# Patient Record
Sex: Female | Born: 1975 | Race: White | Hispanic: No | Marital: Married | Smoking: Current every day smoker
Health system: Southern US, Community
[De-identification: ages and names within clinical notes are randomized; demographics above are authoritative.]

## PROBLEM LIST (undated history)

## (undated) DIAGNOSIS — N200 Calculus of kidney: Secondary | ICD-10-CM

## (undated) HISTORY — PX: TUBAL LIGATION: SHX77

## (undated) HISTORY — PX: CHOLECYSTECTOMY: SHX55

---

## 2008-12-24 ENCOUNTER — Emergency Department: Payer: Self-pay | Admitting: Emergency Medicine

## 2009-01-15 ENCOUNTER — Ambulatory Visit: Payer: Self-pay | Admitting: Urology

## 2009-01-22 ENCOUNTER — Ambulatory Visit: Payer: Self-pay | Admitting: Urology

## 2009-01-29 ENCOUNTER — Ambulatory Visit: Payer: Self-pay | Admitting: Urology

## 2009-02-11 ENCOUNTER — Ambulatory Visit: Payer: Self-pay | Admitting: Urology

## 2009-03-11 ENCOUNTER — Ambulatory Visit: Payer: Self-pay | Admitting: Urology

## 2009-06-17 ENCOUNTER — Ambulatory Visit: Payer: Self-pay | Admitting: Urology

## 2009-07-21 ENCOUNTER — Ambulatory Visit: Payer: Self-pay | Admitting: Urology

## 2010-01-06 ENCOUNTER — Ambulatory Visit: Payer: Self-pay | Admitting: Urology

## 2010-07-05 ENCOUNTER — Inpatient Hospital Stay: Payer: Self-pay | Admitting: Unknown Physician Specialty

## 2010-11-19 ENCOUNTER — Ambulatory Visit: Payer: Self-pay | Admitting: Internal Medicine

## 2011-02-08 ENCOUNTER — Ambulatory Visit: Payer: Self-pay | Admitting: Family Medicine

## 2011-08-30 ENCOUNTER — Ambulatory Visit: Payer: Self-pay | Admitting: Internal Medicine

## 2011-10-13 ENCOUNTER — Emergency Department: Payer: Self-pay | Admitting: Emergency Medicine

## 2012-04-04 ENCOUNTER — Ambulatory Visit: Payer: Self-pay | Admitting: Internal Medicine

## 2012-04-04 LAB — URINALYSIS, COMPLETE
Bacteria: NEGATIVE
Glucose,UR: NEGATIVE mg/dL (ref 0–75)
Ketone: NEGATIVE
Nitrite: NEGATIVE
Ph: 6.5 (ref 4.5–8.0)
Protein: NEGATIVE
WBC UR: NONE SEEN /HPF (ref 0–5)

## 2012-04-06 LAB — URINE CULTURE

## 2012-04-23 ENCOUNTER — Ambulatory Visit: Payer: Self-pay

## 2012-04-23 LAB — URINALYSIS, COMPLETE
Bacteria: NEGATIVE
Bilirubin,UR: NEGATIVE
Ketone: NEGATIVE
Nitrite: NEGATIVE

## 2012-07-18 ENCOUNTER — Ambulatory Visit: Payer: Self-pay | Admitting: Family Medicine

## 2012-07-18 LAB — URINALYSIS, COMPLETE
Bilirubin,UR: NEGATIVE
Glucose,UR: NEGATIVE mg/dL (ref 0–75)
Ketone: NEGATIVE
Leukocyte Esterase: NEGATIVE
Ph: 7.5 (ref 4.5–8.0)
Specific Gravity: 1.01 (ref 1.003–1.030)

## 2012-07-19 LAB — URINE CULTURE

## 2013-06-06 IMAGING — CT CT STONE STUDY
1 of 2 series · 15 of 32 positions shown, 19 images · non-contrast
Comparison: none

REASON FOR EXAM: CR [id number]  abd pain hx kidney stones  eval for
kidney stones
COMMENTS:

[Series 2: abd 3mm wo 3.0 i40f 3 · axial · 0.72mm/px · z∈[-1448,-1044]mm · 15 of 153 slices shown, 19 images]
[im 12/153  soft-tissue]
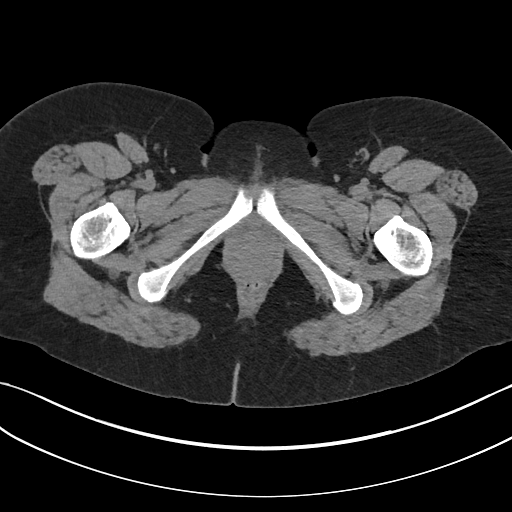
[im 12/153  bone]
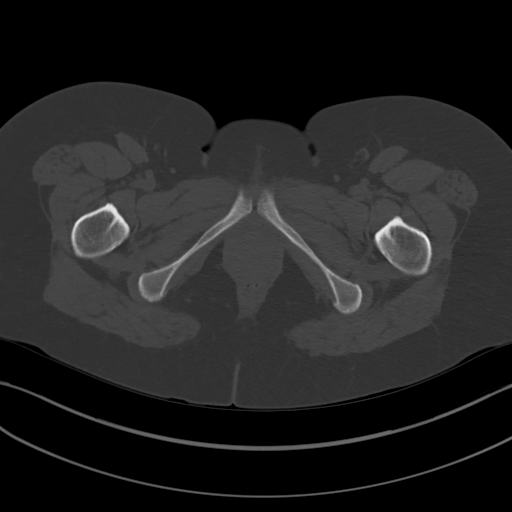
[im 23/153  soft-tissue]
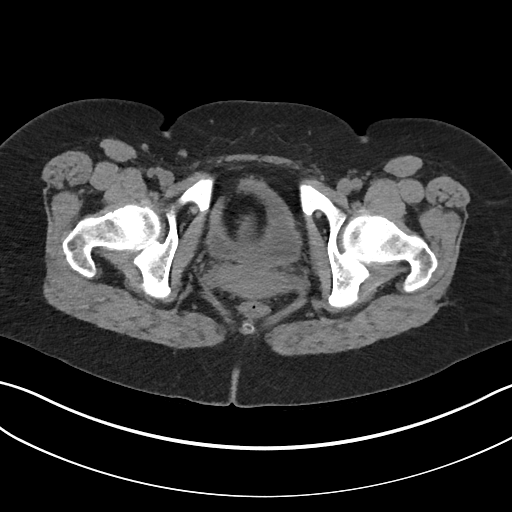
[im 34/153  soft-tissue]
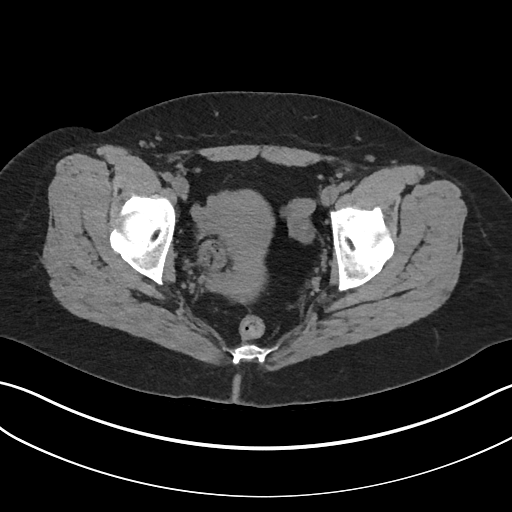
[im 46/153  soft-tissue]
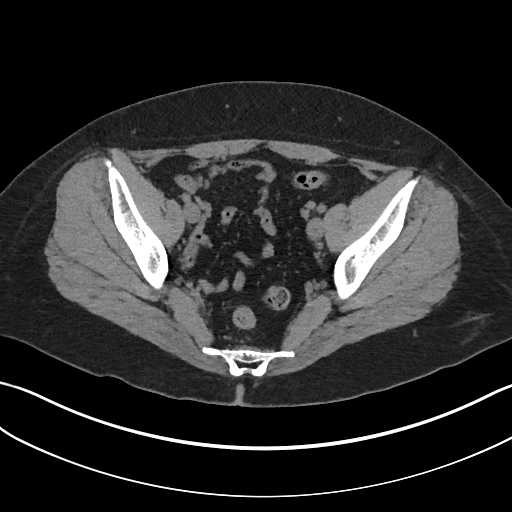
[im 57/153  soft-tissue]
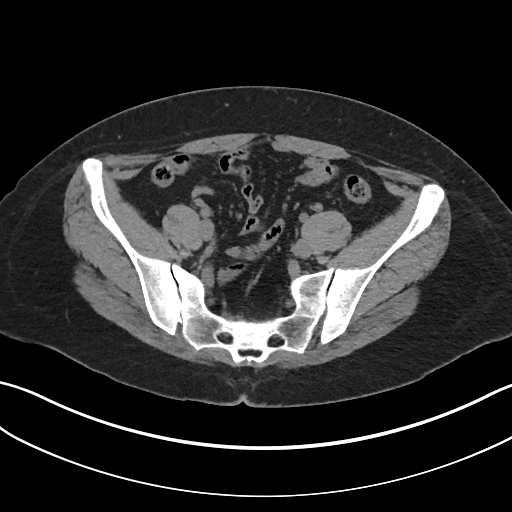
[im 68/153  soft-tissue]
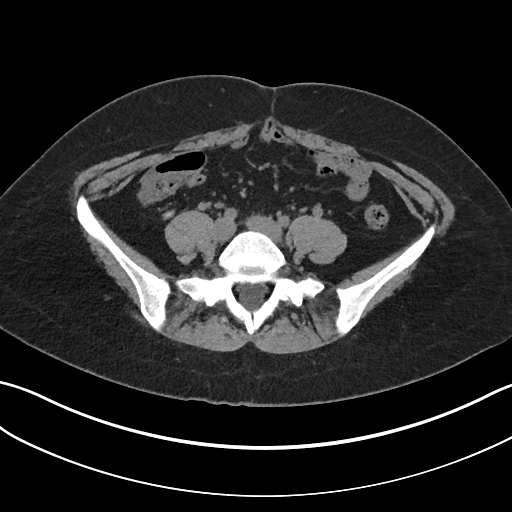
[im 79/153  soft-tissue]
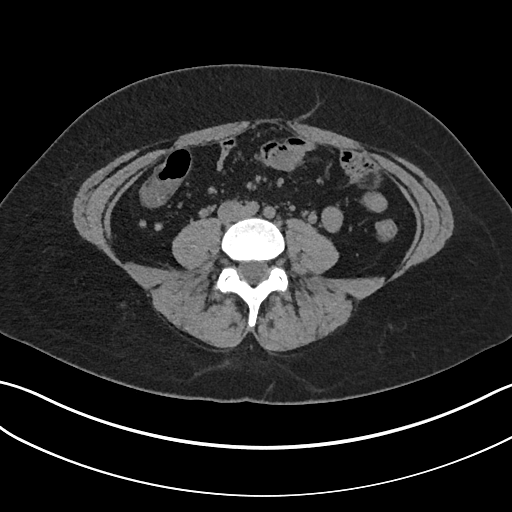
[im 91/153  soft-tissue]
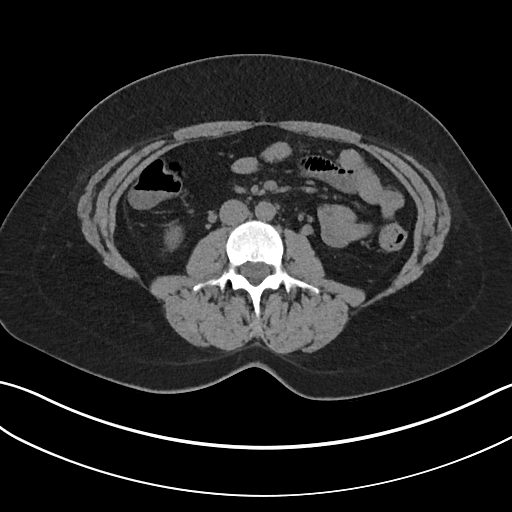
[im 102/153  soft-tissue]
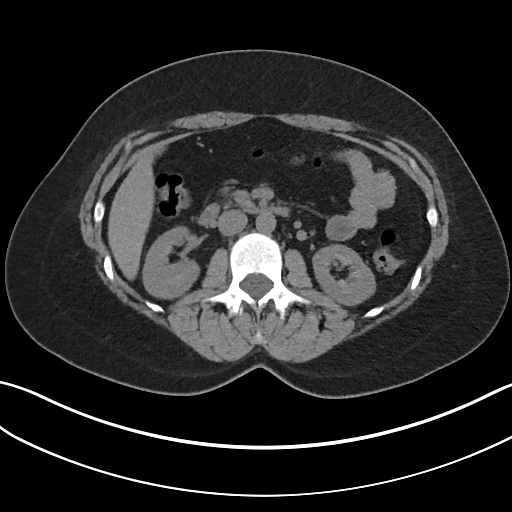
[im 102/153  bone]
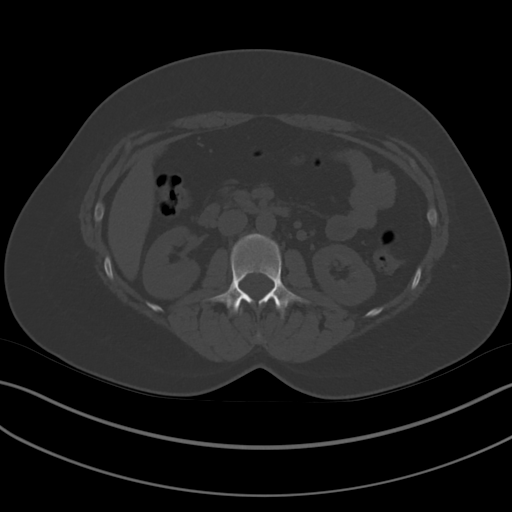
[im 113/153  soft-tissue]
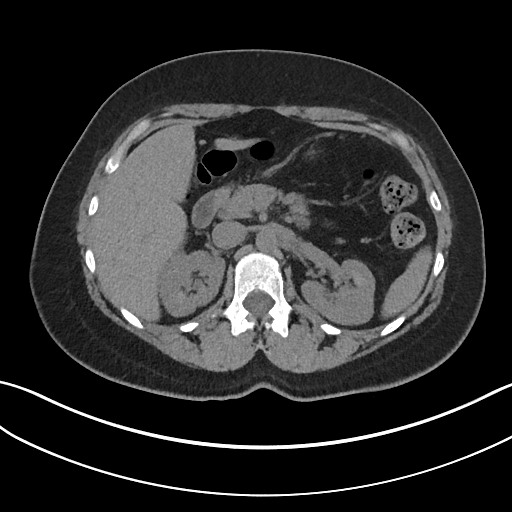
[im 124/153  soft-tissue]
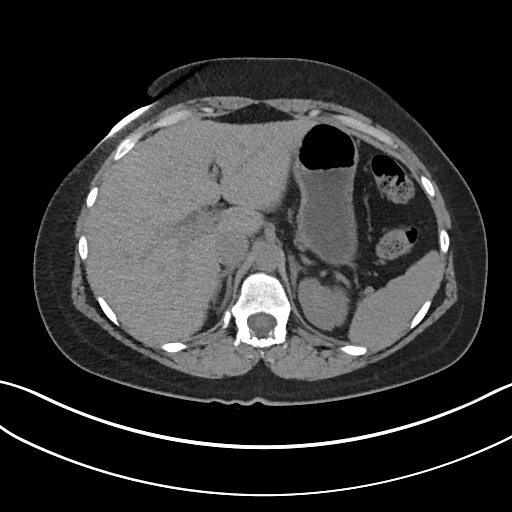
[im 130/153  lung]
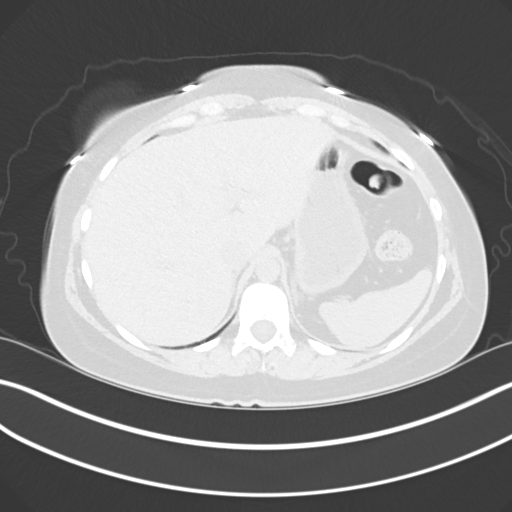
[im 136/153  soft-tissue]
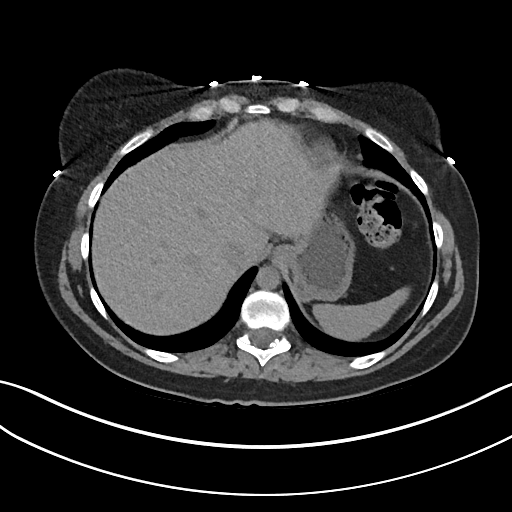
[im 136/153  lung]
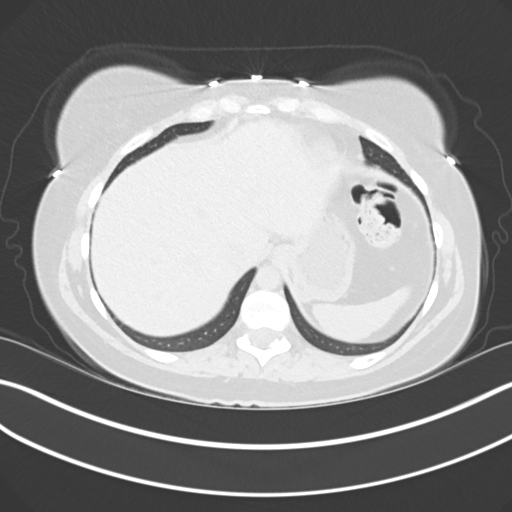
[im 141/153  lung]
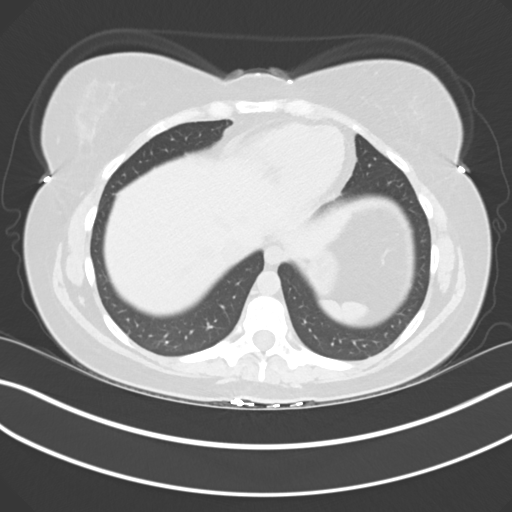
[im 147/153  soft-tissue]
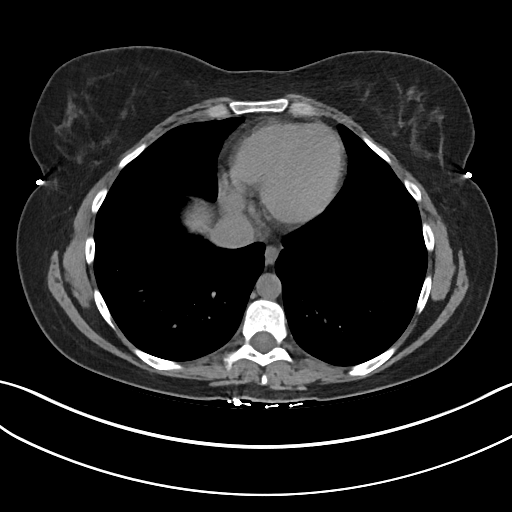
[im 147/153  lung]
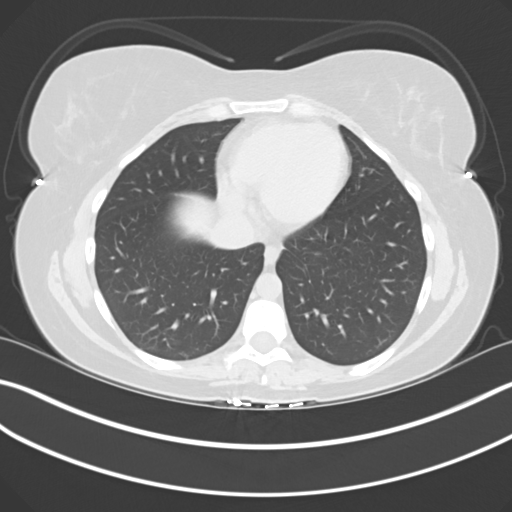

[15 of 32 positions shown; findings below may reference images not displayed]

PROCEDURE:     KCT - KCT ABDOMEN/PELVIS WO (STONE)  - April 23, 2012  [DATE]

RESULT:     Renal stone protocol noncontrast CT of the abdomen and pelvis is
compared to the previous exam dated 13 October, 2011.

There is no hydronephrosis or hydroureter. One to 2 mm nonobstructing lower
pole right renal calculus is seen on image 56 with a tiny nonobstructing 1
mm stone suggested on image 42 in the mid to upper right kidney.
Cholecystectomy clips are present. The liver, spleen, adrenal glands,
kidneys, aorta and appendix appear normal otherwise. There is no ascites or
abnormal fluid collection. The urinary bladder is nondistended. The uterus
and adnexal structures appear to be unremarkable with the exception of the
cystic mass in the right adnexal region measuring is much as 2.87 cm in
diameter. Followup ultrasound of the pelvis could be performed in 6 to 8
weeks to document resolution. There is a small fat filled umbilical hernia
area the lung bases appear clear. The bony structures are unremarkable.
IMPRESSION: 1. Right nephrolithiasis without obstruction.
2. The previously demonstrated distal left ureteral calculus is not present
on today's exam. The obstructive changes on the left have resolved.
3. Right adnexal cy[REDACTED]

## 2016-02-18 ENCOUNTER — Emergency Department: Payer: BLUE CROSS/BLUE SHIELD

## 2016-02-18 ENCOUNTER — Emergency Department
Admission: EM | Admit: 2016-02-18 | Discharge: 2016-02-18 | Disposition: A | Discharge status: Home / Self Care | Payer: BLUE CROSS/BLUE SHIELD | Attending: Emergency Medicine | Admitting: Emergency Medicine

## 2016-02-18 DIAGNOSIS — Z3202 Encounter for pregnancy test, result negative: Secondary | ICD-10-CM | POA: Insufficient documentation

## 2016-02-18 DIAGNOSIS — F17210 Nicotine dependence, cigarettes, uncomplicated: Secondary | ICD-10-CM | POA: Insufficient documentation

## 2016-02-18 DIAGNOSIS — R109 Unspecified abdominal pain: Secondary | ICD-10-CM | POA: Diagnosis not present

## 2016-02-18 HISTORY — DX: Calculus of kidney: N20.0

## 2016-02-18 LAB — URINALYSIS COMPLETE WITH MICROSCOPIC (ARMC ONLY)
BILIRUBIN URINE: NEGATIVE
GLUCOSE, UA: NEGATIVE mg/dL
Ketones, ur: NEGATIVE mg/dL
Leukocytes, UA: NEGATIVE
NITRITE: NEGATIVE
Protein, ur: NEGATIVE mg/dL
Specific Gravity, Urine: 1.002 — ABNORMAL LOW (ref 1.005–1.030)
pH: 7 (ref 5.0–8.0)

## 2016-02-18 LAB — POCT PREGNANCY, URINE: Preg Test, Ur: NEGATIVE

## 2016-02-18 MED ORDER — IBUPROFEN 600 MG PO TABS
ORAL_TABLET | ORAL | Status: AC
  Filled 2016-02-18: qty 1

## 2016-02-18 MED ORDER — HYDROCODONE-ACETAMINOPHEN 5-325 MG PO TABS
1.0000 | ORAL_TABLET | Freq: Once | ORAL | Status: AC
  Administered 2016-02-18: 1 via ORAL

## 2016-02-18 MED ORDER — HYDROCODONE-ACETAMINOPHEN 5-325 MG PO TABS: 1.0000 | ORAL_TABLET | Freq: Four times a day (QID) | ORAL | Status: DC | PRN

## 2016-02-18 MED ORDER — ONDANSETRON 4 MG PO TBDP: 4.0000 mg | ORAL_TABLET | Freq: Four times a day (QID) | ORAL | Status: DC | PRN

## 2016-02-18 MED ORDER — HYDROCODONE-ACETAMINOPHEN 5-325 MG PO TABS
ORAL_TABLET | ORAL | Status: AC
  Filled 2016-02-18: qty 1

## 2016-02-18 MED ORDER — IBUPROFEN 400 MG PO TABS
600.0000 mg | ORAL_TABLET | ORAL | Status: AC
  Administered 2016-02-18: 600 mg via ORAL

## 2016-02-18 NOTE — ED Provider Notes (Signed)
[facility] Emergency Department Provider Note  ____________________________________________  Time seen: Approximately 4:32 PM  I have reviewed the triage vital signs and the nursing notes.   HISTORY  Chief Complaint Flank Pain    HPI Tanya Stevens is a 40 y.o. female reports previous history of kidney stones and a tubal ligation.  Patient reports that when she got up this morning she started having sharp stabbing pain in the "kidney". She reports she's had the same pain about 5 times in the past with kidney stones. No nausea vomiting fevers or chills. No pain or trouble urinating. She denies any vaginal discharge or "pelvic pain". She did not have any pain on the right side and she in fact denies any pain in the abdomen anywhere, states all the pain is located in her left kidney area. Patient points at her left costovertebral angle and showing me the area of pain.  Currently described as moderate, sharp area not worsened by movement. States started to get into a comfortable position.   Past Medical History  Diagnosis Date  . Kidney stones     There are no active problems to display for this patient.   Past Surgical History  Procedure Laterality Date  . Cholecystectomy    . Tubal ligation      Current Outpatient Rx  Name  Route  Sig  Dispense  Refill  . HYDROcodone-acetaminophen (NORCO/VICODIN) 5-325 MG tablet   Oral   Take 1 tablet by mouth every 6 (six) hours as needed for moderate pain.   20 tablet   0   . ondansetron (ZOFRAN ODT) 4 MG disintegrating tablet   Oral   Take 1 tablet (4 mg total) by mouth every 6 (six) hours as needed for nausea or vomiting.   20 tablet   0     Allergies Review of patient's allergies indicates no known allergies.  No family history on file.  Social History Social History  Substance Use Topics  . Smoking status: Current Every Day Smoker    Types: Cigarettes  . Smokeless tobacco: None  . Alcohol  Use: Yes    Review of Systems Constitutional: No fever/chills Eyes: No visual changes. ENT: No sore throat. Cardiovascular: Denies chest pain. Respiratory: Denies shortness of breath. Gastrointestinal: No abdominal pain.  No nausea, no vomiting.  No diarrhea.  No constipation. Genitourinary: Negative for dysuria. Musculoskeletal: Negative for back pain except in the area of the left kidney. Skin: Negative for rash. Neurological: Negative for headaches, focal weakness or numbness.  10-point ROS otherwise negative.  ____________________________________________   PHYSICAL EXAM:  VITAL SIGNS: ED Triage Vitals  Enc Vitals Group     BP 02/18/16 1216 123/81 mmHg     Pulse Rate 02/18/16 1216 77     Resp 02/18/16 1216 16     Temp 02/18/16 1216 98.1 F (36.7 C)     Temp Source 02/18/16 1216 Oral     SpO2 02/18/16 1216 100 %     Weight 02/18/16 1216 152 lb (68.947 kg)     Height 02/18/16 1216  (1.626 m)     Head Cir --      Peak Flow --      Pain Score 02/18/16 1217 6     Pain Loc --      Pain Edu? --      Excl. in GC? --    Constitutional: Alert and oriented. Well appearing and in no acute distress. Eyes: Conjunctivae are normal. PERRL.  EOMI. Head: Atraumatic. Nose: No congestion/rhinnorhea. Mouth/Throat: Mucous membranes are moist.  Oropharynx non-erythematous. Neck: No stridor.   Cardiovascular: Normal rate, regular rhythm. Grossly normal heart sounds.  Good peripheral circulation. Respiratory: Normal respiratory effort.  No retractions. Lungs CTAB. Gastrointestinal: Soft and nontender. No distention. No abdominal bruits. No CVA tenderness on the right, moderate to severe tenderness on the left CVA. No pain at McBurney's point. Negative Murphy. No rebound guarding or evidence of peritonitis on abdominal exam. Musculoskeletal: No lower extremity tenderness nor edema.  No joint effusions. Neurologic:  Normal speech and language. No gross focal neurologic deficits are  appreciated. Skin:  Skin is warm, dry and intact. No rash noted. Psychiatric: Mood and affect are normal. Speech and behavior are normal.  ____________________________________________   LABS (all labs ordered are listed, but only abnormal results are displayed)  Labs Reviewed  URINALYSIS COMPLETEWITH MICROSCOPIC ([facility] ONLY) - Abnormal; Notable for the following:    Color, Urine STRAW (*)    APPearance CLEAR (*)    Specific Gravity, Urine 1.002 (*)    Hgb urine dipstick 1+ (*)    Bacteria, UA RARE (*)    Squamous Epithelial / LPF 0-5 (*)    All other components within normal limits  POC URINE PREG, ED  POCT PREGNANCY, URINE   ____________________________________________  EKG   ____________________________________________  RADIOLOGY  US Renal (Final result) Result time: 02/18/16 17:37:25   Final result by Rad Results In Interface (02/18/16 17:37:25)   Narrative:   CLINICAL DATA: Left flank pain for 8 hours. No known injury. Initial encounter.  EXAM: RENAL / URINARY TRACT ULTRASOUND COMPLETE  COMPARISON: CT abdomen and pelvis 04/23/2012.  FINDINGS: Right Kidney:  Length: 10.5 cm. Echogenicity within normal limits. No mass or hydronephrosis visualized.  Left Kidney:  Length: 10.4 cm. Echogenicity within normal limits. No mass or hydronephrosis visualized.  Bladder:  Appears normal for degree of bladder distention. Bilateral ureteral jets are seen.  IMPRESSION: Negative for hydronephrosis or evidence of urinary tract stone. No acute finding.   Electronically Signed By: Drusilla Kanner M.D. On: 02/18/2016 17:37    ____________________________________________   PROCEDURES  Procedure(s) performed: None  Critical Care performed: No  ____________________________________________   INITIAL IMPRESSION / ASSESSMENT AND PLAN / ED COURSE  Pertinent labs & imaging results that were available during my care of the patient were reviewed by me  and considered in my medical decision making (see chart for details).  Patient presents for left flank pain. She clearly denotes pain in the area of the left flank and costovertebral angle. Differential diagnosis includes but is not limited to, abdominal perforation, aortic dissection, cholecystitis, appendicitis, diverticulitis, colitis, esophagitis/gastritis, kidney stone, pyelonephritis, urinary tract infection, aortic aneurysm. All are considered in decision and treatment plan. Based upon the patient's presentation and risk factors, we will initially start with u/s renal. Her abdominal exam is benign, no evidence of surgical abdomen or peritonitis. Her vital signs are reassuring. Overall appears or focal complaint is left flank pain, and she reports same with previous kidney stones which I suspect this may be. No hematuria is noted, however I'll obtain ultrasound of the left kidney to further evaluate.  ----------------------------------------- 6:11 PM on 02/18/2016 -----------------------------------------  Patient reports that her pain is improved. She is currently resting comfortably in no distress. I did discuss stroke she did not clearly show a kidney stone, and she is requesting to go home as she states she feels better and has been here all day and feels that she  could manage well at home. She is confident this is from a kidney stone. I discussed that I cannot confirm this from a kidney stone, I recommended further evaluation including CT to evaluate for other causes of pain such as infection, aneurysm, ovarian cyst, a "twisted ovary" or other concerns. After discussing CT and further evaluation to rule out my concerns seen her daughter wished to go home. She is overall feeling better, she has a reassuring exam and currently has no abdominal pain and flank pain is better. She is afebrile in no distress. I did discuss and again we did offer further evaluation, but after our discussion and shared  medical decision making she does wish to go home. She seems very capable of following careful return precautions and I advised both her and her daughter on very careful precautions including to return to the emergency room right away if she develop a fever, worsening pain, vomiting, blood in her stool, black stools, worsening pain, feeling weak or lightheaded or further concerns arise. Both she and daughter are agreeable.  I will prescribe the patient a narcotic pain medicine due to their condition which I anticipate will cause at least moderate pain short term. I discussed with the patient safe use of narcotic pain medicines, and that they are not to drive, work in dangerous areas, or ever take more than prescribed (no more than 1 pill every 6 hours). We discussed that this is the type of medication that can be  overdosed on and the risks of this type of medicine. Patient is very agreeable to only use as prescribed and to never use more than prescribed.  Return precautions and treatment recommendations and follow-up discussed with the patient who is agreeable with the plan.  ____________________________________________   FINAL CLINICAL IMPRESSION(S) / ED DIAGNOSES  Final diagnoses:  Acute left flank pain      Sharyn CreamerMark Natnael Biederman, MD 02/18/16 1814

## 2016-02-18 NOTE — Discharge Instructions (Signed)
You were seen in the emergency room for abdominal pain. It is important that you follow up closely with your primary care doctor in the next couple of days.  If you're unable to see her primary care doctor you may return to the emergency room or go to the [facility] walk-in [facility] in 2 or 3days for reexam.  Please return to the emergency room right away if you are to develop a fever, severe nausea, your pain becomes severe or worsens, you are unable to keep food down, begin vomiting any dark or bloody fluid, you develop any dark or bloody stools, feel dehydrated, or other new concerns or symptoms arise.   Flank Pain Flank pain refers to pain that is located on the side of the body between the upper abdomen and the back. The pain may occur over a short period of time (acute) or may be long-term or reoccurring (chronic). It may be mild or severe. Flank pain can be caused by many things. CAUSES  Some of the more common causes of flank pain include:  Muscle strains.   Muscle spasms.   A disease of your spine (vertebral disk disease).   A lung infection (pneumonia).   Fluid around your lungs (pulmonary edema).   A kidney infection.   Kidney stones.   A very painful skin rash caused by the chickenpox virus (shingles).   Gallbladder disease.  HOME CARE INSTRUCTIONS  Home care will depend on the cause of your pain. In general,  Rest as directed by your caregiver.  Drink enough fluids to keep your urine clear or pale yellow.  Only take over-the-counter or prescription medicines as directed by your caregiver. Some medicines may help relieve the pain.  Tell your caregiver about any changes in your pain.  Follow up with your caregiver as directed. SEEK IMMEDIATE MEDICAL CARE IF:   Your pain is not controlled with medicine.   You have new or worsening symptoms.  Your pain increases.   You have abdominal pain.   You have shortness of breath.   You have persistent  nausea or vomiting.   You have swelling in your abdomen.   You feel faint or pass out.   You have blood in your urine.  You have a fever or persistent symptoms for more than 2-3 days.  You have a fever and your symptoms suddenly get worse. MAKE SURE YOU:   Understand these instructions.  Will watch your condition.  Will get help right away if you are not doing well or get worse.   This information is not intended to replace advice given to you by your health care provider. Make sure you discuss any questions you have with your health care provider.   Document Released: 12/29/2005 Document Revised: 08/01/2012 Document Reviewed: 06/21/2012 Elsevier Interactive Patient Education [employer]2016 [employer]

## 2016-02-18 NOTE — ED Notes (Signed)
Pt c/o left flank pain since this morning with nausea.. States she has a hx of kidney stones

## 2016-06-03 IMAGING — US US RENAL
1 series · 14 of 25 positions shown · non-contrast
Comparison: CT abdomen and pelvis 04/23/2012.

CLINICAL DATA: Left flank pain for 8 hours. No known injury.
Initial encounter.

EXAM:
RENAL / URINARY TRACT ULTRASOUND COMPLETE

[Series 1: us renal · 0.22mm/px · 14 of 43 slices shown]
[im 1/43]
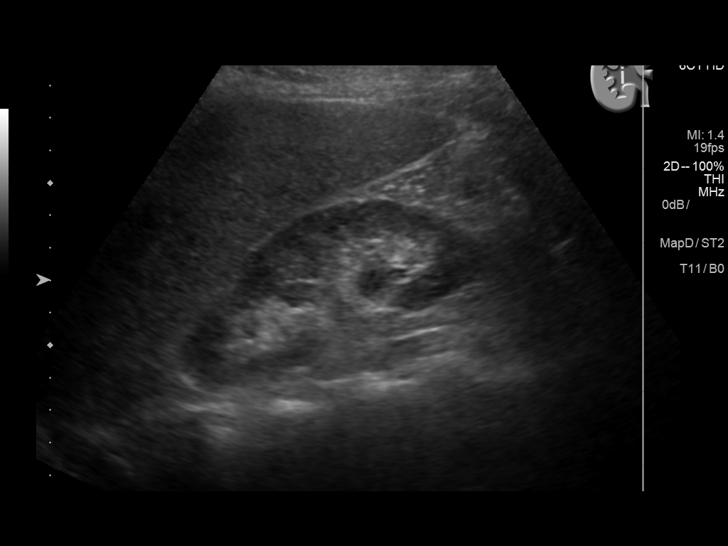
[im 4/43]
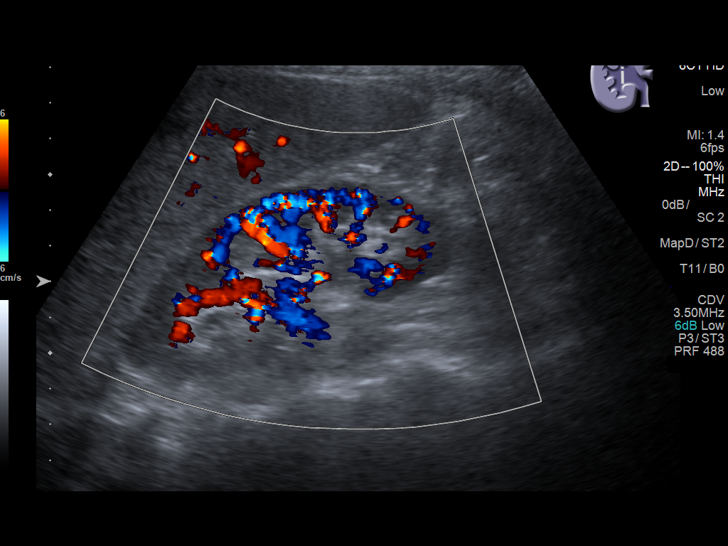
[im 8/43]
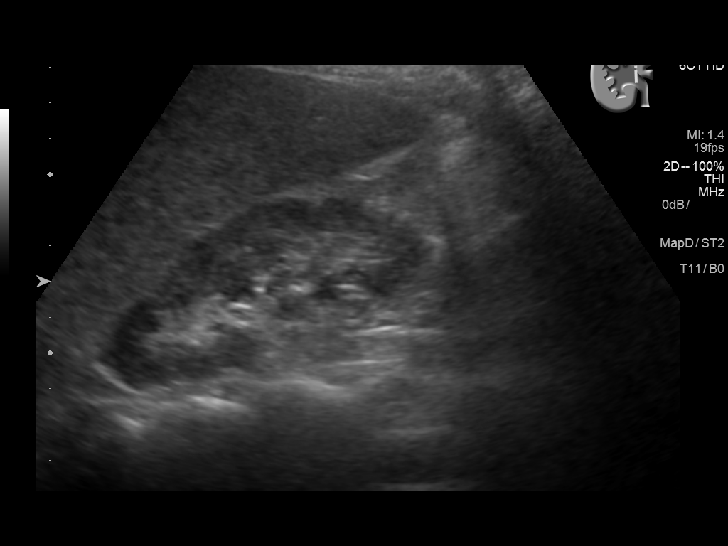
[im 11/43]
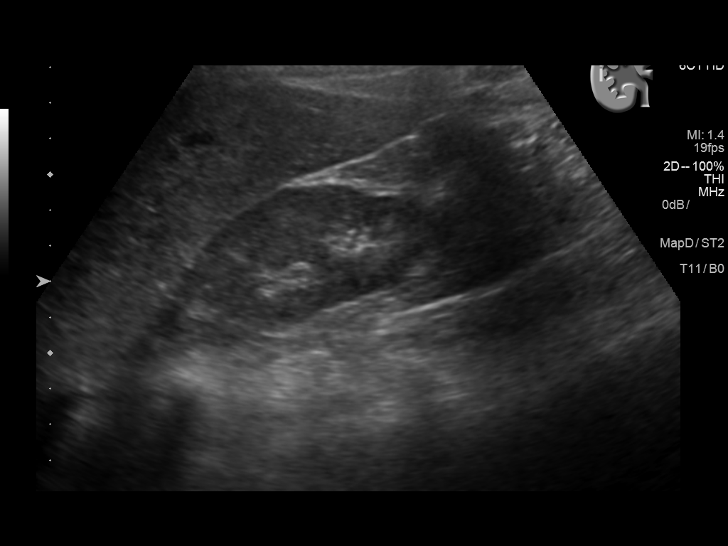
[im 15/43]
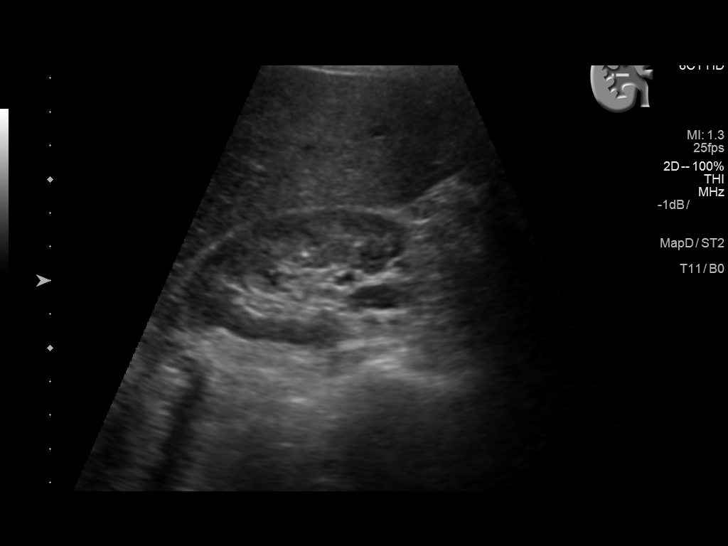
[im 16/43]
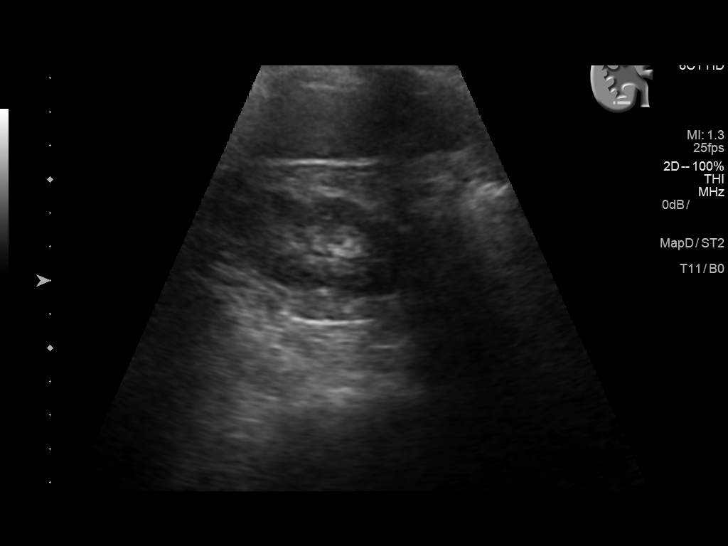
[im 20/43]
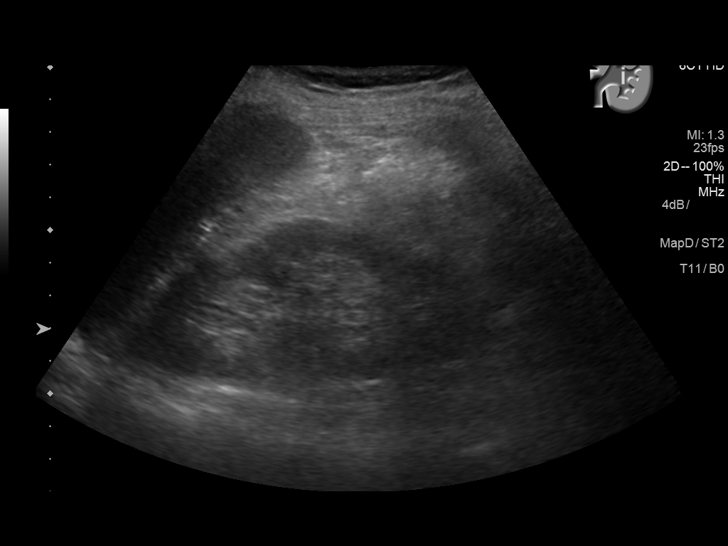
[im 23/43]
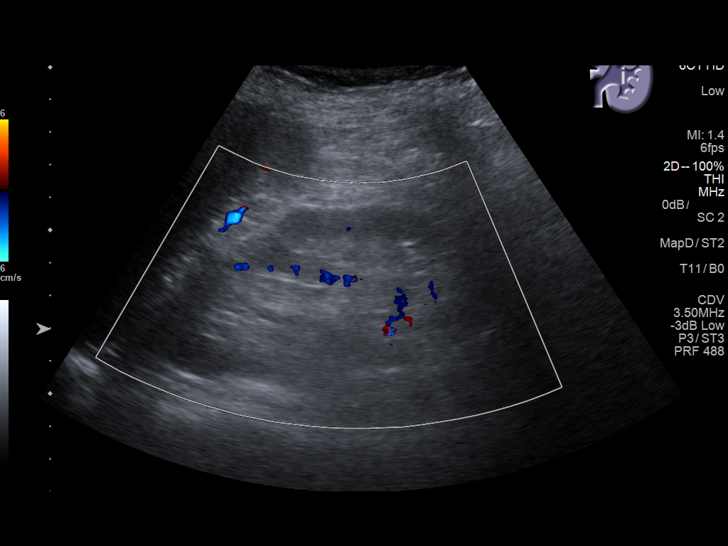
[im 27/43]
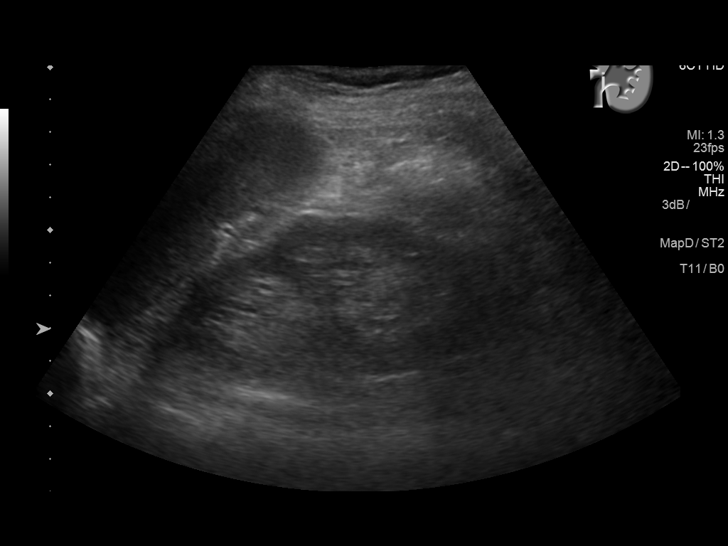
[im 29/43]
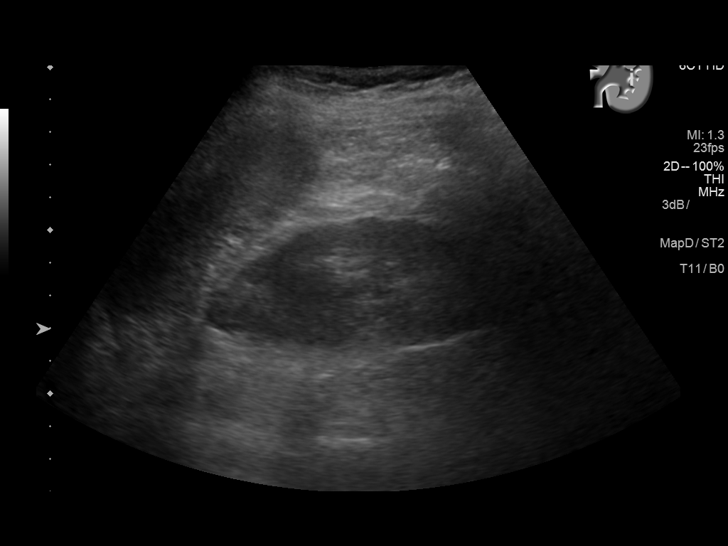
[im 32/43]
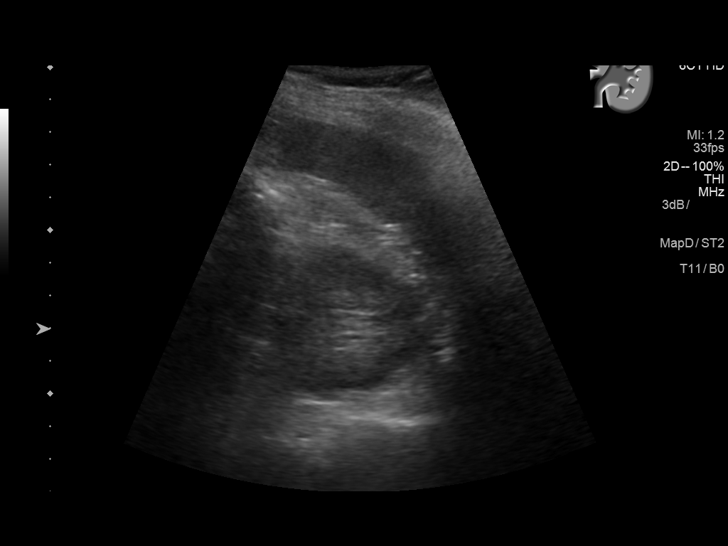
[im 36/43]
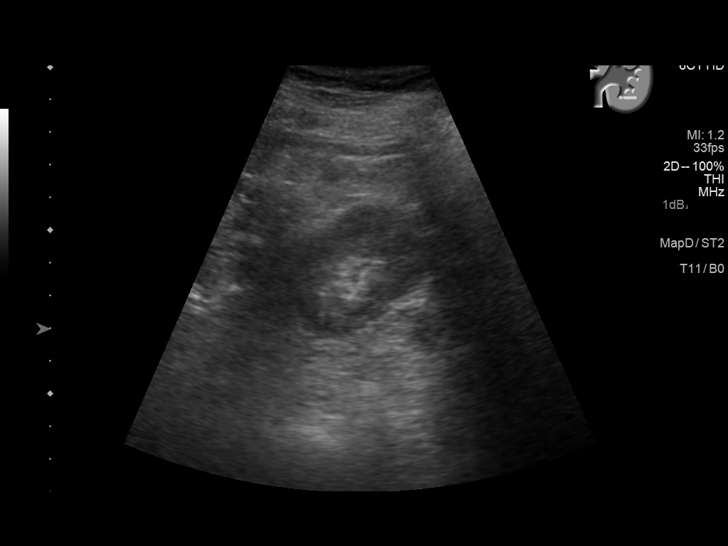
[im 39/43]
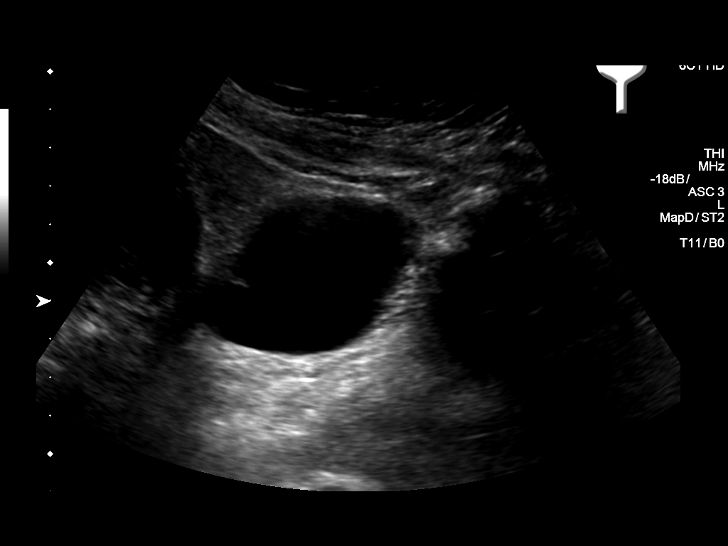
[im 43/43]
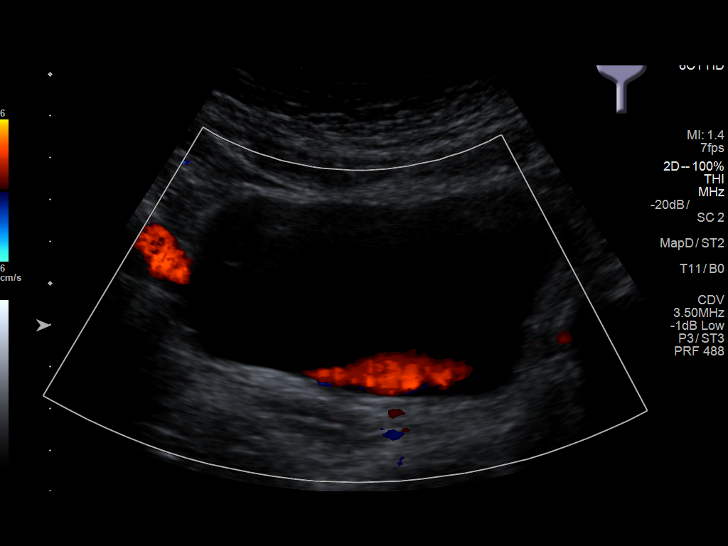

[14 of 25 positions shown; findings below may reference images not displayed]

FINDINGS: Right Kidney:

Length: 10.5 cm. Echogenicity within normal limits. No mass or
hydronephrosis visualized.

Left Kidney:

Length: 10.4 cm. Echogenicity within normal limits. No mass or
hydronephrosis visualized.

Bladder:

Appears normal for degree of bladder distention. Bilateral ureteral
jets are seen.
IMPRESSION: Negative for hydronephrosis or evidence of urinary tract stone. No
acute finding.

## 2018-06-21 ENCOUNTER — Encounter: Payer: Self-pay | Admitting: Emergency Medicine

## 2018-06-21 ENCOUNTER — Ambulatory Visit
Admission: EM | Admit: 2018-06-21 | Discharge: 2018-06-21 | Disposition: A | Discharge status: Home / Self Care | Payer: Self-pay | Attending: Emergency Medicine | Admitting: Emergency Medicine

## 2018-06-21 ENCOUNTER — Other Ambulatory Visit: Payer: Self-pay

## 2018-06-21 ENCOUNTER — Ambulatory Visit (INDEPENDENT_AMBULATORY_CARE_PROVIDER_SITE_OTHER): Payer: Self-pay

## 2018-06-21 DIAGNOSIS — R0789 Other chest pain: Secondary | ICD-10-CM

## 2018-06-21 DIAGNOSIS — R05 Cough: Secondary | ICD-10-CM

## 2018-06-21 DIAGNOSIS — F17210 Nicotine dependence, cigarettes, uncomplicated: Secondary | ICD-10-CM | POA: Insufficient documentation

## 2018-06-21 DIAGNOSIS — R079 Chest pain, unspecified: Secondary | ICD-10-CM | POA: Insufficient documentation

## 2018-06-21 MED ORDER — CYCLOBENZAPRINE HCL 10 MG PO TABS: ORAL_TABLET | ORAL | 0 refills | Status: AC

## 2018-06-21 NOTE — ED Provider Notes (Signed)
[facility]    CSN: [id number] Arrival date & time: 06/21/18  1620     History   Chief Complaint Chief Complaint  Patient presents with  . Chest Pain    HPI Tanya Stevens is a 42 y.o. female.   42 year old female presents with central chest pain for the past 4 days. Was at work at [employer] on Monday (4 days ago) when she started having entire body muscle "spasms" and central chest pain and slight dizziness. She sat down for a few minutes and the various muscle spasms improved but the central chest pain continued. She finished her shift, went home and relaxed. Pain has continued constantly with varying degrees of intensity. Felt slightly nauseous today but otherwise has been eating normally. No vomiting or diarrhea. No longer dizzy. No vision changes. Denies any fever or URI symptoms. Does smoke 1ppd of cigarettes daily and has an occasional cough but no distinct difficulty breathing or shortness of breath. No distinct numbness. No recent alcohol and no illicit drug use. No chronic health issues. No history of HTN, CAD, hyperlipidemia, diabetes or other cardiac risk factors besides smoking. Takes no daily medication. Has tried Ibuprofen for pain with minimal relief. Maternal grandfather had CHF and paternal grandmother had a stroke but no cardiac issues in parents.   The history is provided by the patient.  Chest Pain  Pain location:  Substernal area Pain quality: aching and sharp   Pain radiates to:  Does not radiate Pain severity:  Moderate Onset quality:  Sudden Duration:  4 days Timing:  Constant Progression:  Waxing and waning Chronicity:  New Context: not breathing, not drug use, not eating, not lifting, not movement, not raising an arm, not at rest, not stress and not trauma   Relieved by:  Nothing Worsened by:  Nothing Ineffective treatments: Ibuprofen. Associated symptoms: cough and dizziness (but now resolved)   Associated symptoms: no abdominal pain, no  altered mental status, no anorexia, no back pain, no claudication, no diaphoresis, no dysphagia, no fatigue, no fever, no headache, no heartburn, no lower extremity edema, no nausea, no near-syncope, no numbness, no orthopnea, no palpitations, no PND, no shortness of breath, no syncope, no vomiting and no weakness   Risk factors: smoking   Risk factors: no aortic disease, no birth control, no coronary artery disease, no diabetes mellitus, no high cholesterol, no hypertension, no immobilization, not female, no Marfan's syndrome, not obese, not pregnant, no prior DVT/PE and no surgery     Past Medical History:  Diagnosis Date  . Kidney stones     There are no active problems to display for this patient.   Past Surgical History:  Procedure Laterality Date  . CHOLECYSTECTOMY    . TUBAL LIGATION      OB History   None      Home Medications    Prior to Admission medications   Medication Sig Start Date End Date Taking? Authorizing Provider  cyclobenzaprine (FLEXERIL) 10 MG tablet Take 1/2 to 1 whole tablet by mouth every 8 hours as needed for muscle pain/cramping. 06/21/18   Sudie Grumbling, NP    Family History Family History  Problem Relation Age of Onset  . Seizures Mother   . Emphysema Father   . Cancer Paternal Grandmother   . Cancer Paternal Grandfather     Social History Social History   Tobacco Use  . Smoking status: Current Every Day Smoker    Packs/day: 1.00  Types: Cigarettes  . Smokeless tobacco: Never Used  Substance Use Topics  . Alcohol use: Yes    Comment: occasional  . Drug use: Not on file     Allergies   Patient has no known allergies.   Review of Systems Review of Systems  Constitutional: Negative for activity change, appetite change, chills, diaphoresis, fatigue and fever.  HENT: Negative for congestion, postnasal drip, sinus pressure, sinus pain and trouble swallowing.   Eyes: Negative for photophobia and visual disturbance.  Respiratory:  Positive for cough. Negative for chest tightness, shortness of breath and wheezing.   Cardiovascular: Positive for chest pain. Negative for palpitations, orthopnea, claudication, syncope, PND and near-syncope.  Gastrointestinal: Negative for abdominal pain, anorexia, constipation, diarrhea, heartburn, nausea and vomiting.  Musculoskeletal: Negative for arthralgias, back pain, myalgias, neck pain and neck stiffness.  Skin: Negative for color change, rash and wound.  Allergic/Immunologic: Negative for immunocompromised state.  Neurological: Positive for dizziness (but now resolved). Negative for tremors, seizures, syncope, facial asymmetry, speech difficulty, weakness, light-headedness, numbness and headaches.  Hematological: Negative for adenopathy. Does not bruise/bleed easily.     Physical Exam Triage Vital Signs ED Triage Vitals  Enc Vitals Group     BP 06/21/18 1631 131/74     Pulse Rate 06/21/18 1631 83     Resp 06/21/18 1631 18     Temp 06/21/18 1631 98.5 F (36.9 C)     Temp src --      SpO2 06/21/18 1631 100 %     Weight 06/21/18 1627 150 lb (68 kg)     Height 06/21/18 1627 5\' 4"  (1.626 m)     Head Circumference --      Peak Flow --      Pain Score 06/21/18 1627 4     Pain Loc --      Pain Edu? --      Excl. in GC? --    No data found.  Updated Vital Signs BP 131/74 (BP Location: Left Arm)   Pulse 83   Temp 98.5 F (36.9 C)   Resp 18   Ht 5\' 4"  (1.626 m)   Wt 150 lb (68 kg)   LMP 06/21/2018   SpO2 100%   BMI 25.75 kg/m   Visual Acuity Right Eye Distance:   Left Eye Distance:   Bilateral Distance:    Right Eye Near:   Left Eye Near:    Bilateral Near:     Physical Exam  Constitutional: She is oriented to person, place, and time. Vital signs are normal. She appears well-developed and well-nourished. She is cooperative. She does not appear ill. No distress.  Patient sitting on exam table in no acute distress but appears slightly anxious.   HENT:  Head:  Normocephalic and atraumatic.  Right Ear: Hearing, tympanic membrane, external ear and ear canal normal.  Left Ear: Hearing, tympanic membrane, external ear and ear canal normal.  Nose: Nose normal. Right sinus exhibits no maxillary sinus tenderness and no frontal sinus tenderness. Left sinus exhibits no maxillary sinus tenderness and no frontal sinus tenderness.  Mouth/Throat: Uvula is midline, oropharynx is clear and moist and mucous membranes are normal. She has dentures.  Eyes: Pupils are equal, round, and reactive to light. Conjunctivae and EOM are normal.  Neck: Normal range of motion. Neck supple.  Cardiovascular: Normal rate, regular rhythm, normal heart sounds, intact distal pulses and normal pulses.  No extrasystoles are present.  No murmur heard.  No systolic murmur is present. Pulmonary/Chest:  Effort normal and breath sounds normal. No respiratory distress. She has no decreased breath sounds. She has no wheezes. She has no rhonchi. She has no rales.  Abdominal: Soft. Normal appearance and bowel sounds are normal. She exhibits no distension, no fluid wave, no abdominal bruit and no mass. There is no hepatosplenomegaly. There is no tenderness. No hernia.  Musculoskeletal: Normal range of motion.  Lymphadenopathy:    She has no cervical adenopathy.  Neurological: She is alert and oriented to person, place, and time. She has normal strength. No sensory deficit.  Skin: Skin is warm and dry. Capillary refill takes less than 2 seconds. No rash noted.  Psychiatric: She has a normal mood and affect. Her speech is normal and behavior is normal. Judgment and thought content normal. Cognition and memory are normal.  Vitals reviewed.    UC Treatments / Results  Labs (all labs ordered are listed, but only abnormal results are displayed) Labs Reviewed - No data to display  EKG None  Radiology Dg Chest 2 View  Result Date: 06/21/2018 CLINICAL DATA:  Mid chest pain x4 days EXAM: CHEST - 2  VIEW COMPARISON:  None. FINDINGS: Lungs are clear.  No pleural effusion or pneumothorax. The heart is normal in size. Visualized osseous structures are within normal limits. Cholecystectomy clips. IMPRESSION: Normal chest radiographs. Electronically Signed   By: Charline Bills M.D.   On: 06/21/2018 18:14    Procedures ED EKG Date/Time: 06/21/2018 11:02 PM Performed by: Sudie Grumbling, NP Authorized by: Domenick Gong, MD   ECG reviewed by ED Physician in the absence of a cardiologist: yes   Previous ECG:    Previous ECG:  Unavailable Interpretation:    Interpretation: normal   Rate:    ECG rate:  73   ECG rate assessment: normal   Rhythm:    Rhythm: sinus rhythm   QRS:    QRS axis:  Normal   QRS intervals:  Normal Conduction:    Conduction: normal   ST segments:    ST segments:  Normal T waves:    T waves: normal   Comments:     No distinct EKG abnormalities detected   (including critical care time)  Medications Ordered in UC Medications - No data to display  Initial Impression / Assessment and Plan / UC Course  I have reviewed the triage vital signs and the nursing notes.  Pertinent labs & imaging results that were available during my care of the patient were reviewed by me and considered in my medical decision making (see chart for details).    Reviewed normal chest x-ray results with patient. Reviewed normal EKG. Discussed that uncertain of cause of chest pain but may be due to persistent chest wall muscle cramping/spasms. Recommend trial Flexeril every 8 hours as needed. May take Tylenol 1000mg  or Ibuprofen 600mg  every 8 hours as needed for pain. Continue to monitor symptoms. If pain continues or gets worse or any difficulty breathing, shortness of breath, dizziness, change in vision, nausea, arm or back pain occur, go to the ER ASAP. Otherwise, follow-up with a PCP within 1 week for recheck.  Final Clinical Impressions(s) / UC Diagnoses   Final diagnoses:    Nonspecific chest pain     Discharge Instructions     Recommend continue to monitor symptoms. May take Flexeril 1/2 to 1 whole tablet every 8 hours as needed for muscle spasms/pain. May also take Tylenol 1000mg  every 8 hours as needed for pain. Recommend go to the  ER ASAP if pain continues or any additional symptoms develop such as difficulty breathing, shortness of breath, dizziness, change in vision, arm or back pain. Otherwise follow-up with a PCP within 1 week for recheck.     ED Prescriptions    Medication Sig Dispense Auth. Provider   cyclobenzaprine (FLEXERIL) 10 MG tablet Take 1/2 to 1 whole tablet by mouth every 8 hours as needed for muscle pain/cramping. 15 tablet Sudie Grumbling, NP     Controlled Substance Prescriptions [place] Controlled Substance Registry consulted? Not Applicable   Sudie Grumbling, NP 06/21/18 2311

## 2018-06-21 NOTE — Discharge Instructions (Signed)
Recommend continue to monitor symptoms. May take Flexeril 1/2 to 1 whole tablet every 8 hours as needed for muscle spasms/pain. May also take Tylenol 1000mg  every 8 hours as needed for pain. Recommend go to the ER ASAP if pain continues or any additional symptoms develop such as difficulty breathing, shortness of breath, dizziness, change in vision, arm or back pain. Otherwise follow-up with a PCP within 1 week for recheck.

## 2018-06-21 NOTE — ED Triage Notes (Signed)
Patient states she was at work on Monday and felt all her muscles tense, developed chest pain and dizziness.  Patient states the chest pain has been constant since Monday sometimes more intense than others.

## 2019-08-04 IMAGING — CR DG CHEST 2V
2 series · 2 of 2 positions shown · non-contrast
Comparison: None.

CLINICAL DATA: Mid chest pain x4 days

EXAM:
CHEST - 2 VIEW

[chest pa]
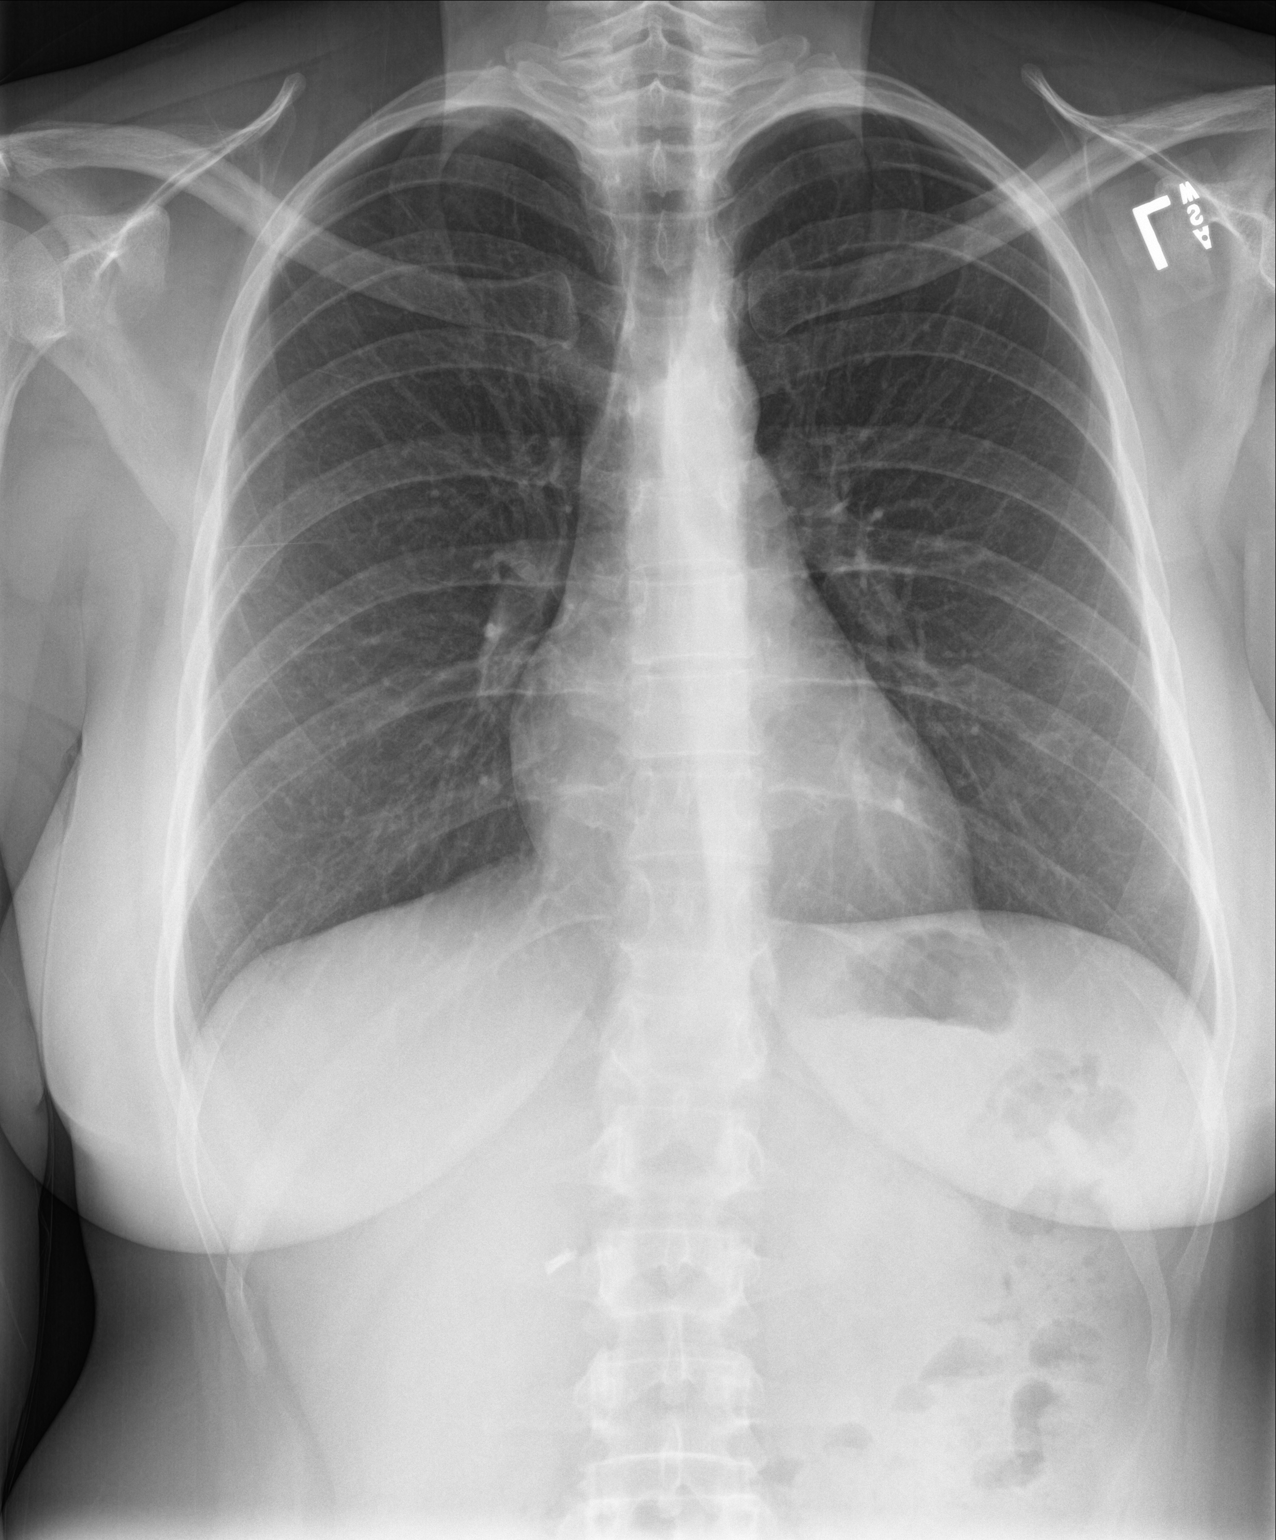

[chest lat]
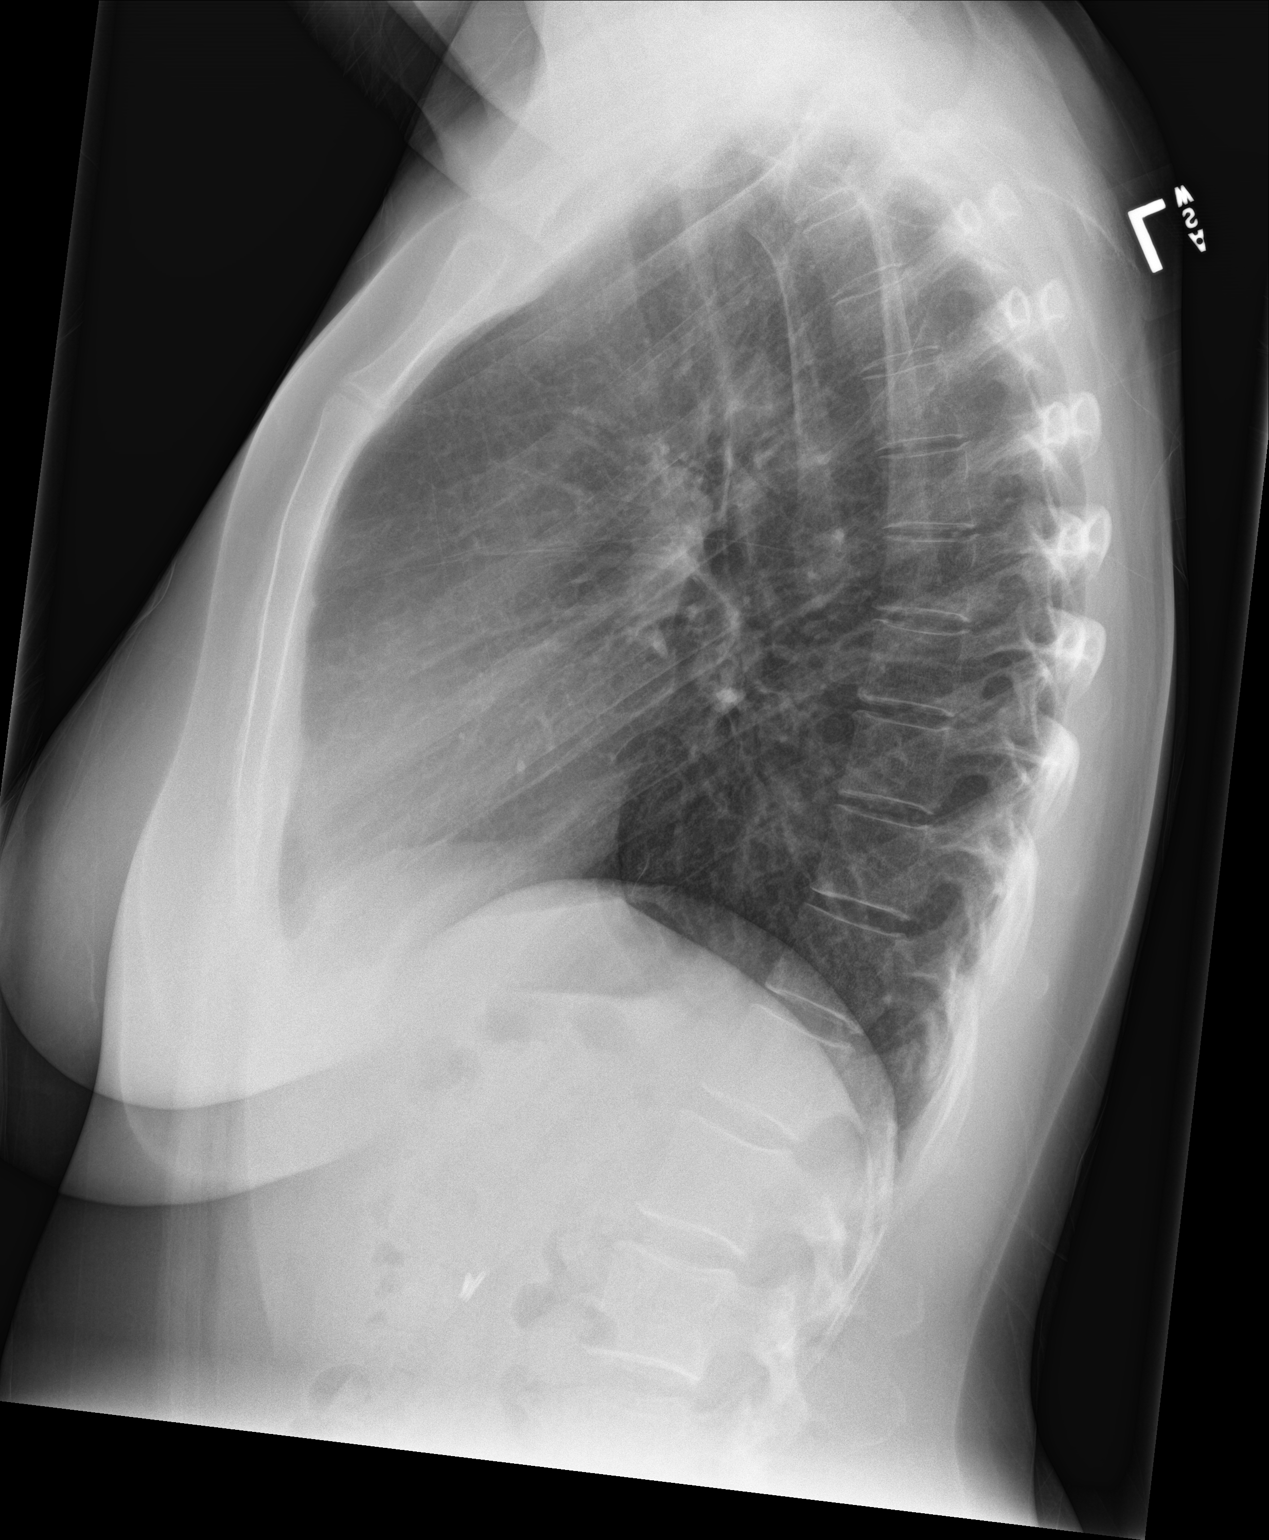

[2 of 2 positions shown; findings below may reference images not displayed]

FINDINGS: Lungs are clear.  No pleural effusion or pneumothorax.

The heart is normal in size.

Visualized osseous structures are within normal limits.

Cholecystectomy clips.
IMPRESSION: Normal chest radiographs.
